# Patient Record
Sex: Male | Born: 1950 | Race: Black or African American | Hispanic: No | Marital: Married
Health system: Southern US, Community
[De-identification: ages and names within clinical notes are randomized; demographics above are authoritative.]

---

## 2015-03-03 ENCOUNTER — Telehealth: Payer: Self-pay

## 2015-03-03 ENCOUNTER — Other Ambulatory Visit (HOSPITAL_COMMUNITY): Payer: Self-pay | Admitting: Family Medicine

## 2015-03-03 DIAGNOSIS — I340 Nonrheumatic mitral (valve) insufficiency: Secondary | ICD-10-CM

## 2015-03-03 NOTE — Telephone Encounter (Signed)
LEFT MESSAGE ON REFERAL LINE AS A REMINDER THAT PT HAS AN UPCOMING ECHO AND PRECERT MAY BE NEEDED -IF ANY QUESTIONS I LEFT MY NAME AND CONTACT NUMBER

## 2015-03-09 ENCOUNTER — Ambulatory Visit (HOSPITAL_COMMUNITY): Payer: 59 | Attending: Cardiology

## 2015-03-09 ENCOUNTER — Other Ambulatory Visit: Payer: Self-pay

## 2015-03-09 DIAGNOSIS — I10 Essential (primary) hypertension: Secondary | ICD-10-CM | POA: Insufficient documentation

## 2015-03-09 DIAGNOSIS — I371 Nonrheumatic pulmonary valve insufficiency: Secondary | ICD-10-CM | POA: Insufficient documentation

## 2015-03-09 DIAGNOSIS — I059 Rheumatic mitral valve disease, unspecified: Secondary | ICD-10-CM | POA: Diagnosis present

## 2015-03-09 DIAGNOSIS — I340 Nonrheumatic mitral (valve) insufficiency: Secondary | ICD-10-CM | POA: Insufficient documentation

## 2017-08-22 ENCOUNTER — Other Ambulatory Visit: Payer: Self-pay | Admitting: Family Medicine

## 2017-08-22 ENCOUNTER — Ambulatory Visit
Admission: RE | Admit: 2017-08-22 | Discharge: 2017-08-22 | Disposition: A | Discharge status: Home / Self Care | Payer: Medicare Other | Source: Ambulatory Visit | Attending: Family Medicine | Admitting: Family Medicine

## 2017-08-22 DIAGNOSIS — J209 Acute bronchitis, unspecified: Secondary | ICD-10-CM

## 2017-08-22 DIAGNOSIS — R05 Cough: Secondary | ICD-10-CM | POA: Diagnosis not present

## 2017-12-31 DIAGNOSIS — H40023 Open angle with borderline findings, high risk, bilateral: Secondary | ICD-10-CM | POA: Diagnosis not present

## 2018-02-05 DIAGNOSIS — H40013 Open angle with borderline findings, low risk, bilateral: Secondary | ICD-10-CM | POA: Diagnosis not present

## 2018-02-14 DIAGNOSIS — I340 Nonrheumatic mitral (valve) insufficiency: Secondary | ICD-10-CM | POA: Diagnosis not present

## 2018-02-14 DIAGNOSIS — N183 Chronic kidney disease, stage 3 (moderate): Secondary | ICD-10-CM | POA: Diagnosis not present

## 2018-02-14 DIAGNOSIS — K5900 Constipation, unspecified: Secondary | ICD-10-CM | POA: Diagnosis not present

## 2018-02-14 DIAGNOSIS — I10 Essential (primary) hypertension: Secondary | ICD-10-CM | POA: Diagnosis not present

## 2018-02-14 DIAGNOSIS — E7800 Pure hypercholesterolemia, unspecified: Secondary | ICD-10-CM | POA: Diagnosis not present

## 2018-02-14 DIAGNOSIS — N400 Enlarged prostate without lower urinary tract symptoms: Secondary | ICD-10-CM | POA: Diagnosis not present

## 2018-07-25 DIAGNOSIS — J209 Acute bronchitis, unspecified: Secondary | ICD-10-CM | POA: Diagnosis not present

## 2018-07-29 IMAGING — DX DG CHEST 2V
2 series · 2 of 2 positions shown · non-contrast
Comparison: None.

CLINICAL DATA: Acute bronchitis, cough x 2 weeks, non smoker

EXAM:
CHEST  2 VIEW

[dg chest 2 view (1 of 2)]
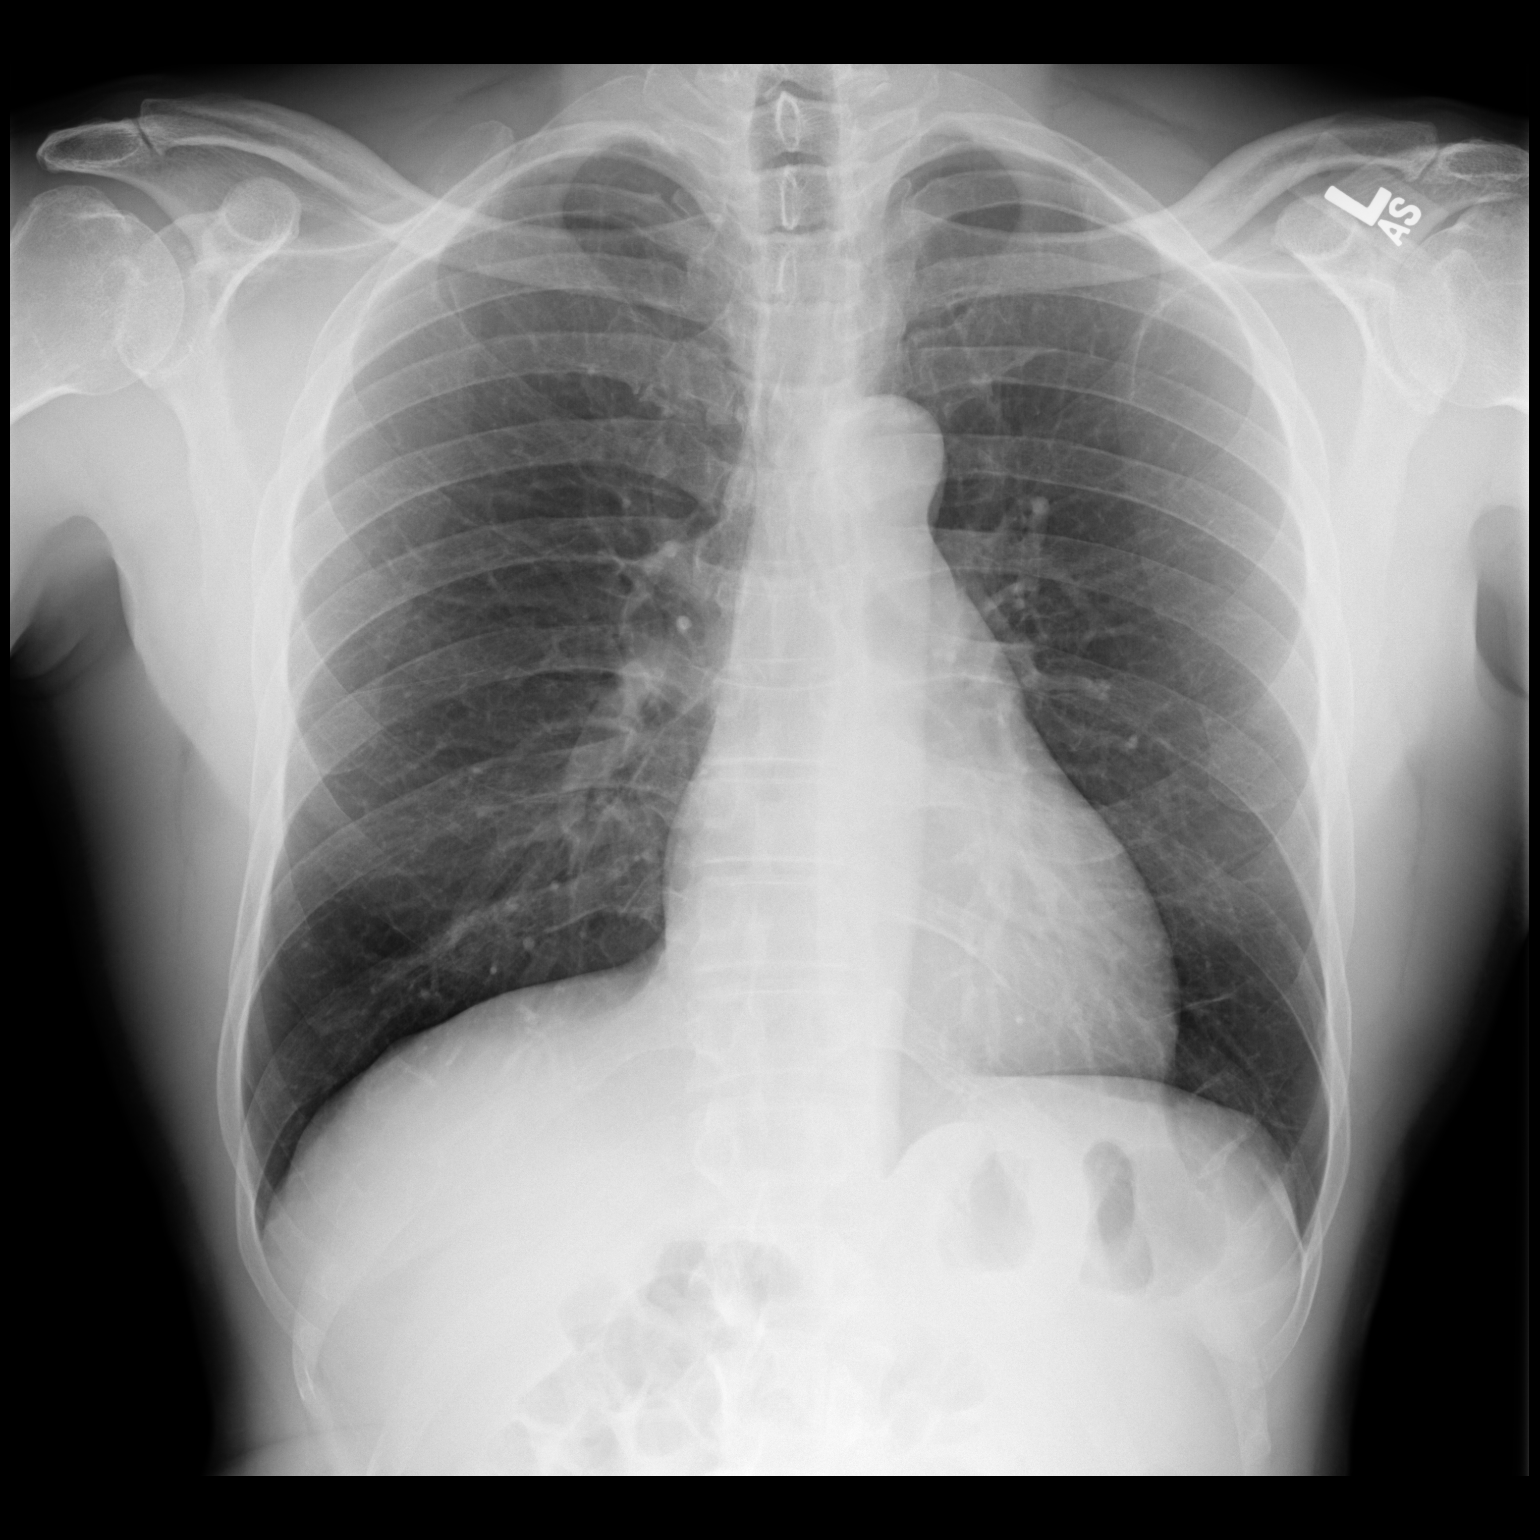

[dg chest 2 view (2 of 2)]
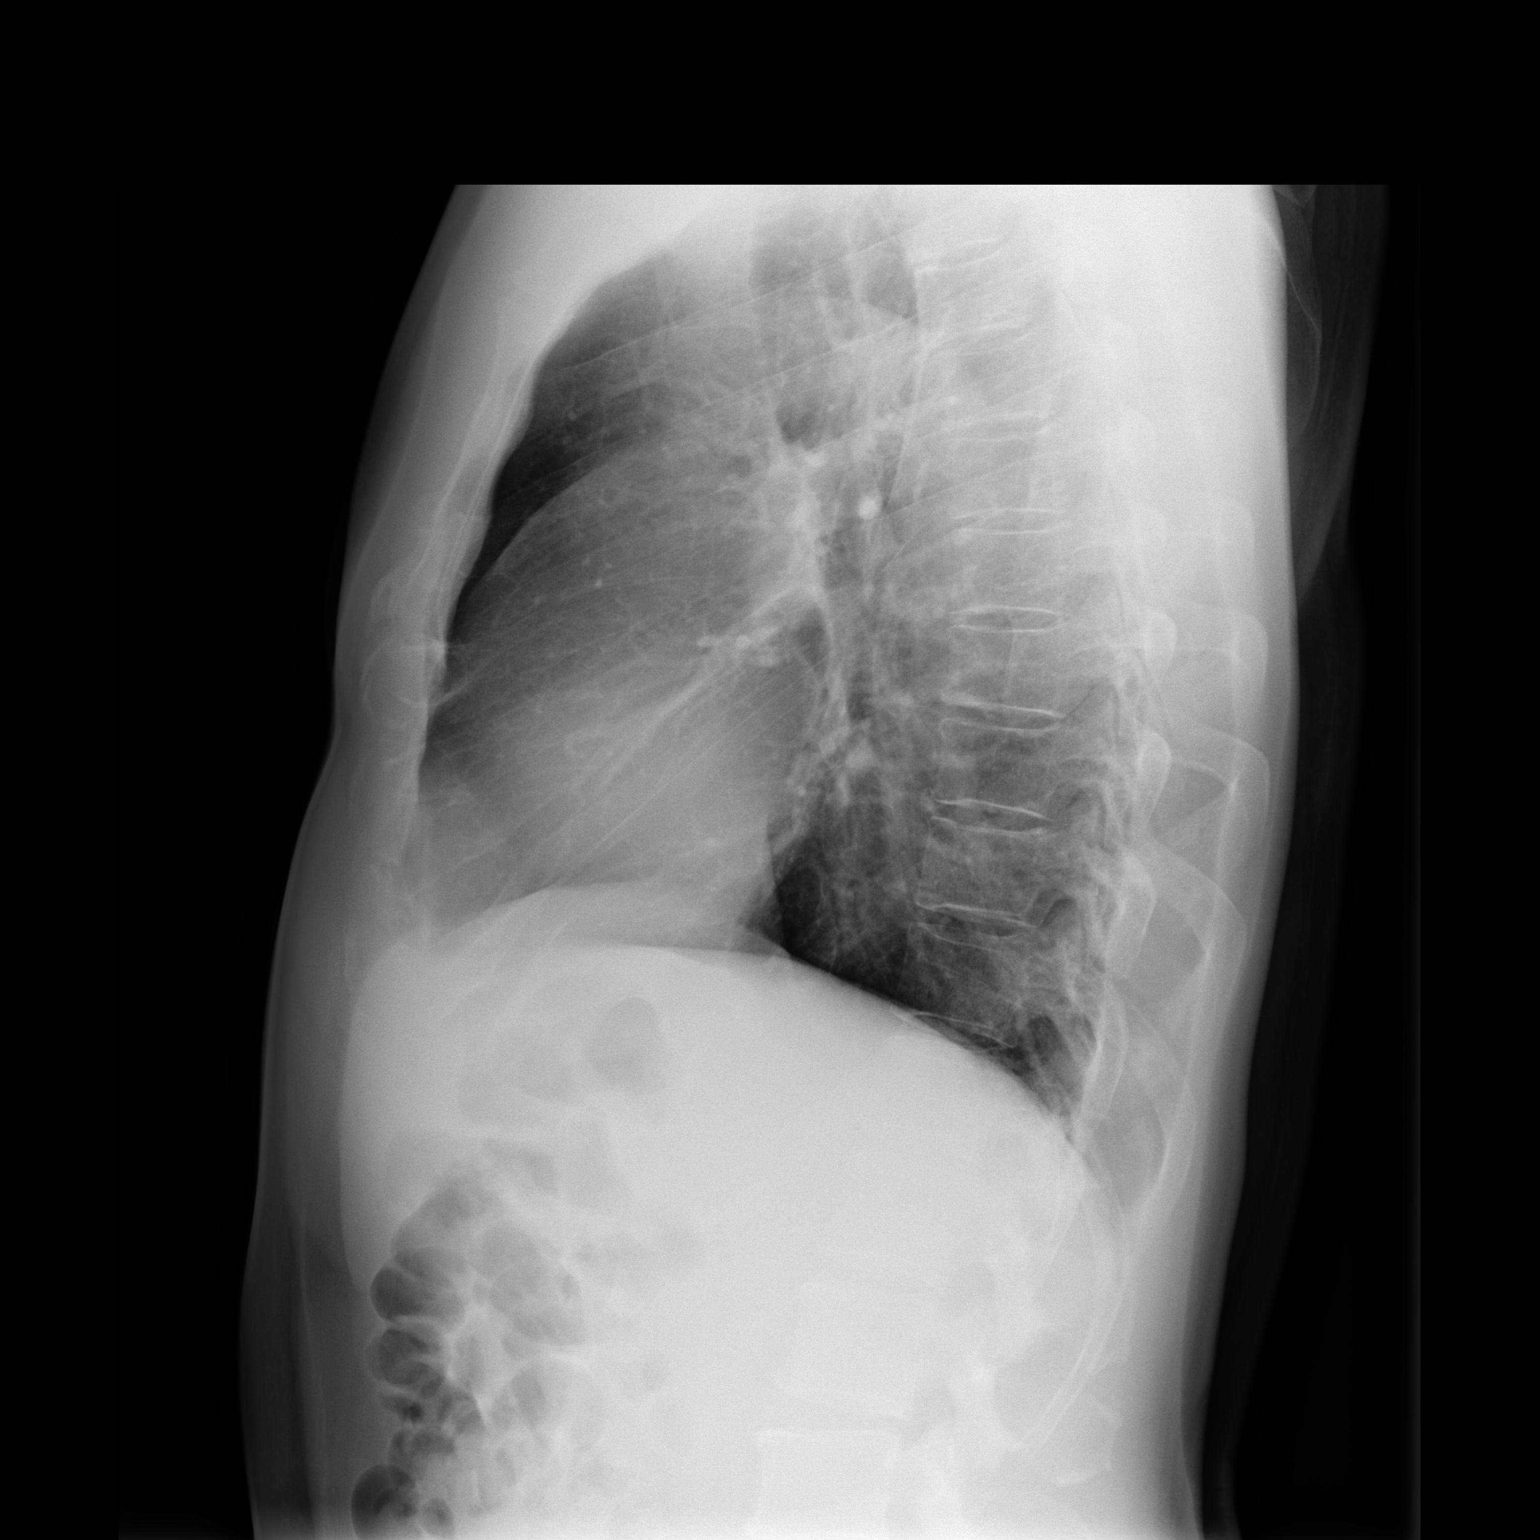

[2 of 2 positions shown; findings below may reference images not displayed]

FINDINGS: The heart size and mediastinal contours are within normal limits.
Both lungs are clear. No pleural effusion or pneumothorax. The
visualized skeletal structures are unremarkable.
IMPRESSION: No active cardiopulmonary disease.

## 2019-08-07 DIAGNOSIS — H52209 Unspecified astigmatism, unspecified eye: Secondary | ICD-10-CM | POA: Diagnosis not present

## 2019-08-07 DIAGNOSIS — I10 Essential (primary) hypertension: Secondary | ICD-10-CM | POA: Diagnosis not present

## 2019-08-20 DIAGNOSIS — H401130 Primary open-angle glaucoma, bilateral, stage unspecified: Secondary | ICD-10-CM | POA: Diagnosis not present

## 2019-12-04 DIAGNOSIS — H401130 Primary open-angle glaucoma, bilateral, stage unspecified: Secondary | ICD-10-CM | POA: Diagnosis not present

## 2020-03-01 DIAGNOSIS — Z20822 Contact with and (suspected) exposure to covid-19: Secondary | ICD-10-CM | POA: Diagnosis not present

## 2020-03-01 DIAGNOSIS — I491 Atrial premature depolarization: Secondary | ICD-10-CM | POA: Diagnosis not present

## 2020-03-01 DIAGNOSIS — R42 Dizziness and giddiness: Secondary | ICD-10-CM | POA: Diagnosis not present

## 2020-03-01 DIAGNOSIS — I517 Cardiomegaly: Secondary | ICD-10-CM | POA: Diagnosis not present

## 2020-03-01 DIAGNOSIS — Z79899 Other long term (current) drug therapy: Secondary | ICD-10-CM | POA: Diagnosis not present

## 2020-03-01 DIAGNOSIS — R519 Headache, unspecified: Secondary | ICD-10-CM | POA: Diagnosis not present

## 2020-03-01 DIAGNOSIS — I10 Essential (primary) hypertension: Secondary | ICD-10-CM | POA: Diagnosis not present

## 2020-03-01 DIAGNOSIS — Z87891 Personal history of nicotine dependence: Secondary | ICD-10-CM | POA: Diagnosis not present

## 2020-03-01 DIAGNOSIS — J984 Other disorders of lung: Secondary | ICD-10-CM | POA: Diagnosis not present

## 2020-03-01 DIAGNOSIS — E785 Hyperlipidemia, unspecified: Secondary | ICD-10-CM | POA: Diagnosis not present

## 2020-03-01 DIAGNOSIS — I160 Hypertensive urgency: Secondary | ICD-10-CM | POA: Diagnosis not present

## 2020-03-01 DIAGNOSIS — R509 Fever, unspecified: Secondary | ICD-10-CM | POA: Diagnosis not present

## 2020-03-02 DIAGNOSIS — R509 Fever, unspecified: Secondary | ICD-10-CM | POA: Diagnosis not present

## 2020-03-02 DIAGNOSIS — R519 Headache, unspecified: Secondary | ICD-10-CM | POA: Diagnosis not present

## 2020-03-02 DIAGNOSIS — I491 Atrial premature depolarization: Secondary | ICD-10-CM | POA: Diagnosis not present

## 2020-03-02 DIAGNOSIS — J984 Other disorders of lung: Secondary | ICD-10-CM | POA: Diagnosis not present

## 2020-03-02 DIAGNOSIS — I517 Cardiomegaly: Secondary | ICD-10-CM | POA: Diagnosis not present

## 2020-03-23 DIAGNOSIS — E663 Overweight: Secondary | ICD-10-CM | POA: Diagnosis not present

## 2020-03-23 DIAGNOSIS — E7800 Pure hypercholesterolemia, unspecified: Secondary | ICD-10-CM | POA: Diagnosis not present

## 2020-03-23 DIAGNOSIS — Z6826 Body mass index (BMI) 26.0-26.9, adult: Secondary | ICD-10-CM | POA: Diagnosis not present

## 2020-03-23 DIAGNOSIS — I10 Essential (primary) hypertension: Secondary | ICD-10-CM | POA: Diagnosis not present

## 2020-03-23 DIAGNOSIS — N182 Chronic kidney disease, stage 2 (mild): Secondary | ICD-10-CM | POA: Diagnosis not present

## 2021-01-23 DIAGNOSIS — I499 Cardiac arrhythmia, unspecified: Secondary | ICD-10-CM | POA: Diagnosis not present

## 2021-01-23 DIAGNOSIS — N182 Chronic kidney disease, stage 2 (mild): Secondary | ICD-10-CM | POA: Diagnosis not present

## 2021-01-23 DIAGNOSIS — I491 Atrial premature depolarization: Secondary | ICD-10-CM | POA: Diagnosis not present

## 2021-01-23 DIAGNOSIS — E7800 Pure hypercholesterolemia, unspecified: Secondary | ICD-10-CM | POA: Diagnosis not present

## 2021-01-23 DIAGNOSIS — E663 Overweight: Secondary | ICD-10-CM | POA: Diagnosis not present

## 2021-01-23 DIAGNOSIS — Z6825 Body mass index (BMI) 25.0-25.9, adult: Secondary | ICD-10-CM | POA: Diagnosis not present

## 2021-01-23 DIAGNOSIS — I10 Essential (primary) hypertension: Secondary | ICD-10-CM | POA: Diagnosis not present

## 2021-02-10 DIAGNOSIS — I10 Essential (primary) hypertension: Secondary | ICD-10-CM | POA: Diagnosis not present

## 2021-02-10 DIAGNOSIS — I499 Cardiac arrhythmia, unspecified: Secondary | ICD-10-CM | POA: Diagnosis not present

## 2021-02-10 DIAGNOSIS — I491 Atrial premature depolarization: Secondary | ICD-10-CM | POA: Diagnosis not present

## 2021-03-01 DIAGNOSIS — I10 Essential (primary) hypertension: Secondary | ICD-10-CM | POA: Diagnosis not present

## 2021-03-01 DIAGNOSIS — N1831 Chronic kidney disease, stage 3a: Secondary | ICD-10-CM | POA: Diagnosis not present

## 2021-04-06 DIAGNOSIS — I10 Essential (primary) hypertension: Secondary | ICD-10-CM | POA: Diagnosis not present

## 2021-04-06 DIAGNOSIS — N281 Cyst of kidney, acquired: Secondary | ICD-10-CM | POA: Diagnosis not present
